# Patient Record
Sex: Female | Born: 1980 | Race: Black or African American | Hispanic: No | Marital: Single | Smoking: Never smoker
Health system: Southern US, Community
[De-identification: ages and names within clinical notes are randomized; demographics above are authoritative.]

---

## 2016-09-02 ENCOUNTER — Emergency Department (HOSPITAL_COMMUNITY)
Admission: EM | Admit: 2016-09-02 | Discharge: 2016-09-02 | Disposition: A | Discharge status: Home / Self Care | Payer: Medicaid Other | Attending: Emergency Medicine | Admitting: Emergency Medicine

## 2016-09-02 ENCOUNTER — Encounter (HOSPITAL_COMMUNITY): Payer: Self-pay | Admitting: Nurse Practitioner

## 2016-09-02 ENCOUNTER — Emergency Department (HOSPITAL_COMMUNITY): Payer: Medicaid Other

## 2016-09-02 DIAGNOSIS — R079 Chest pain, unspecified: Secondary | ICD-10-CM | POA: Insufficient documentation

## 2016-09-02 DIAGNOSIS — Z5321 Procedure and treatment not carried out due to patient leaving prior to being seen by health care provider: Secondary | ICD-10-CM | POA: Insufficient documentation

## 2016-09-02 LAB — BASIC METABOLIC PANEL
ANION GAP: 8 (ref 5–15)
BUN: 10 mg/dL (ref 6–20)
CO2: 27 mmol/L (ref 22–32)
Calcium: 9.1 mg/dL (ref 8.9–10.3)
Chloride: 101 mmol/L (ref 101–111)
Creatinine, Ser: 0.82 mg/dL (ref 0.44–1.00)
GFR calc Af Amer: >60 mL/min (ref >60)
GFR calc non Af Amer: >60 mL/min (ref >60)
GLUCOSE: 104 mg/dL — AB (ref 65–99)
POTASSIUM: 3.6 mmol/L (ref 3.5–5.1)
Sodium: 136 mmol/L (ref 135–145)

## 2016-09-02 LAB — I-STAT TROPONIN, ED: Troponin i, poc: 0 ng/mL (ref 0.00–0.08)

## 2016-09-02 LAB — CBC
HEMATOCRIT: 32.8 % — AB (ref 36.0–46.0)
HEMOGLOBIN: 10.5 g/dL — AB (ref 12.0–15.0)
MCH: 22.8 pg — AB (ref 26.0–34.0)
MCHC: 32 g/dL (ref 30.0–36.0)
MCV: 71.1 fL — ABNORMAL LOW (ref 78.0–100.0)
Platelets: 274 10*3/uL (ref 150–400)
RBC: 4.61 MIL/uL (ref 3.87–5.11)
RDW: 15.5 % (ref 11.5–15.5)
WBC: 8.8 10*3/uL (ref 4.0–10.5)

## 2016-09-02 NOTE — ED Notes (Signed)
Pt to nurse first asking about wait time. Pt informed pt of wait time and pt states her wish to come back in the morning, pt encouraged to stay and is sitting back with family ion the waiting room at this moment.

## 2016-09-02 NOTE — ED Notes (Signed)
Pt states she will go to her doc. In the morning.

## 2016-09-02 NOTE — ED Triage Notes (Signed)
Pt presents with c/o chest pain. The pain has been intermittent over the past year. She reports fatigue, dizziness, shortness of breath, lower extremity edema,  Nausea. She denies weakness, fevers, cough. The symptoms are improved with rest. The pain increased severely when she was outside working in the heat today so she decided to seek treatment, but has not been evaluated for these symptoms in the past

## 2016-09-03 ENCOUNTER — Emergency Department (HOSPITAL_COMMUNITY)
Admission: EM | Admit: 2016-09-03 | Discharge: 2016-09-03 | Disposition: A | Discharge status: Home / Self Care | Payer: Medicaid Other | Attending: Emergency Medicine | Admitting: Emergency Medicine

## 2016-09-03 ENCOUNTER — Encounter (HOSPITAL_COMMUNITY): Payer: Self-pay | Admitting: Emergency Medicine

## 2016-09-03 DIAGNOSIS — R609 Edema, unspecified: Secondary | ICD-10-CM

## 2016-09-03 DIAGNOSIS — R600 Localized edema: Secondary | ICD-10-CM | POA: Insufficient documentation

## 2016-09-03 DIAGNOSIS — D649 Anemia, unspecified: Secondary | ICD-10-CM | POA: Insufficient documentation

## 2016-09-03 DIAGNOSIS — Z79899 Other long term (current) drug therapy: Secondary | ICD-10-CM | POA: Diagnosis not present

## 2016-09-03 DIAGNOSIS — R2243 Localized swelling, mass and lump, lower limb, bilateral: Secondary | ICD-10-CM | POA: Diagnosis present

## 2016-09-03 LAB — BASIC METABOLIC PANEL
ANION GAP: 6 (ref 5–15)
BUN: 10 mg/dL (ref 6–20)
CHLORIDE: 102 mmol/L (ref 101–111)
CO2: 28 mmol/L (ref 22–32)
Calcium: 8.5 mg/dL — ABNORMAL LOW (ref 8.9–10.3)
Creatinine, Ser: 0.72 mg/dL (ref 0.44–1.00)
GFR calc Af Amer: >60 mL/min (ref >60)
GFR calc non Af Amer: >60 mL/min (ref >60)
Glucose, Bld: 75 mg/dL (ref 65–99)
POTASSIUM: 4.2 mmol/L (ref 3.5–5.1)
SODIUM: 136 mmol/L (ref 135–145)

## 2016-09-03 LAB — CBC WITH DIFFERENTIAL/PLATELET
Basophils Absolute: 0 10*3/uL (ref 0.0–0.1)
Basophils Relative: 1 %
EOS ABS: 0.1 10*3/uL (ref 0.0–0.7)
Eosinophils Relative: 2 %
HCT: 31.7 % — ABNORMAL LOW (ref 36.0–46.0)
HEMOGLOBIN: 10.2 g/dL — AB (ref 12.0–15.0)
LYMPHS ABS: 2.6 10*3/uL (ref 0.7–4.0)
LYMPHS PCT: 39 %
MCH: 23.1 pg — AB (ref 26.0–34.0)
MCHC: 32.2 g/dL (ref 30.0–36.0)
MCV: 71.9 fL — ABNORMAL LOW (ref 78.0–100.0)
Monocytes Absolute: 0.5 10*3/uL (ref 0.1–1.0)
Monocytes Relative: 7 %
NEUTROS PCT: 51 %
Neutro Abs: 3.5 10*3/uL (ref 1.7–7.7)
Platelets: 268 10*3/uL (ref 150–400)
RBC: 4.41 MIL/uL (ref 3.87–5.11)
RDW: 15.5 % (ref 11.5–15.5)
WBC: 6.7 10*3/uL (ref 4.0–10.5)

## 2016-09-03 LAB — BRAIN NATRIURETIC PEPTIDE: B NATRIURETIC PEPTIDE 5: 43 pg/mL (ref 0.0–100.0)

## 2016-09-03 LAB — TROPONIN I: Troponin I: <0.03 ng/mL (ref <0.03)

## 2016-09-03 MED ORDER — FUROSEMIDE 20 MG PO TABS: 20.0000 mg | ORAL_TABLET | Freq: Every day | ORAL | 0 refills | Status: AC

## 2016-09-03 NOTE — ED Provider Notes (Signed)
[facility]-EMER[facility]ENCY DEPT Provider Note   CSN: [id number] Arrival date & time: 09/03/16  1005     History   Chief Complaint Chief Complaint  Patient presents with  . Leg Swelling    HPI Diana Wong Foot is a 36 y.o. female.  HPI Pt states she was has been having trouble with fatigue, chest discomfort and leg swelling for the last two weeks.  It seems to be getting worse.  She has been drinking a lot of water but it has not helped.  It is better in the morning and worse as the day progresses.   She went to see her doctor today who sent her to the ED. History reviewed. No pertinent past medical history.  There are no active problems to display for this patient.   History reviewed. No pertinent surgical history.  OB History    No data available       Home Medications    Prior to Admission medications   Medication Sig Start Date End Date Taking? Authorizing Provider  furosemide (LASIX) 20 MG tablet Take 1 tablet (20 mg total) by mouth daily. 09/03/16   Linwood DibblesKnapp, Sani Loiseau, MD    Family History History reviewed. No pertinent family history.  Social History Social History  Substance Use Topics  . Smoking status: Never Smoker  . Smokeless tobacco: Never Used  . Alcohol use No     Allergies   Patient has no known allergies.   Review of Systems Review of Systems  Constitutional: Negative for fever.  Gastrointestinal: Positive for nausea.       Acid reflux sx  Neurological: Positive for headaches.  All other systems reviewed and are negative.    Physical Exam Updated Vital Signs BP 113/76 (BP Location: Left Arm)   Pulse (!) 59   Temp 98.1 F (36.7 C) (Oral)   Resp 18   Ht 1.753 m (5\' 9" )   Wt (!) 148.8 kg (328 lb)   LMP 08/15/2016 (Approximate)   SpO2 98%   BMI 48.44 kg/m   Physical Exam  Constitutional: She appears well-developed and well-nourished. No distress.  HENT:  Head: Normocephalic and atraumatic.  Right Ear: External ear normal.  Left Ear: External  ear normal.  Eyes: Conjunctivae are normal. Right eye exhibits no discharge. Left eye exhibits no discharge. No scleral icterus.  Neck: Neck supple. No tracheal deviation present.  Cardiovascular: Normal rate, regular rhythm and intact distal pulses.   Pulmonary/Chest: Effort normal and breath sounds normal. No stridor. No respiratory distress. She has no wheezes. She has no rales.  Abdominal: Soft. Bowel sounds are normal. She exhibits no distension. There is no tenderness. There is no rebound and no guarding.  Musculoskeletal: She exhibits no edema or tenderness.  Neurological: She is alert. She has normal strength. No cranial nerve deficit (no facial droop, extraocular movements intact, no slurred speech) or sensory deficit. She exhibits normal muscle tone. She displays no seizure activity. Coordination normal.  Skin: Skin is warm and dry. No rash noted.  Psychiatric: She has a normal mood and affect.  Nursing note and vitals reviewed.    ED Treatments / Results  Labs (all labs ordered are listed, but only abnormal results are displayed) Labs Reviewed  CBC WITH DIFFERENTIAL/PLATELET - Abnormal; Notable for the following:       Result Value   Hemoglobin 10.2 (*)    HCT 31.7 (*)    MCV 71.9 (*)    MCH 23.1 (*)    All other components  within normal limits  BASIC METABOLIC PANEL - Abnormal; Notable for the following:    Calcium 8.5 (*)    All other components within normal limits  BRAIN NATRIURETIC PEPTIDE  TROPONIN I     Radiology Dg Chest 2 View  Result Date: 09/02/2016 CLINICAL DATA:  36 year old female with chest pain. EXAM: CHEST  2 VIEW COMPARISON:  None. FINDINGS: The heart size and mediastinal contours are within normal limits. Both lungs are clear. The visualized skeletal structures are unremarkable. IMPRESSION: No active cardiopulmonary disease. Electronically Signed   By: Elgie Collard M.D.   On: 09/02/2016 19:50    Procedures Procedures (including critical care  time)  Medications Ordered in ED Medications - No data to display   Initial Impression / Assessment and Plan / ED Course  I have reviewed the triage vital signs and the nursing notes.  Pertinent labs & imaging results that were available during my care of the patient were reviewed by me and considered in my medical decision making (see chart for details).   Pt presented to the ED for peripheral edema and fatigue.  Pt also complained of chest pain.  Pt is low risk heart score for MACE.   Doubt ACS as the cause of her sx.     No signs of CHF.  No PE risk factors.  Perc negative.  Doubt PE.  I suspect her symptoms are related to peripheral edema.  Discussed diuretic use, diet, exercise, weight loss.  Follow up with PCP to check her anemia, most likely iron deficiency.  Consider thyroid testing, anemia testing.  At this time there does not appear to be any evidence of an acute emergency medical condition and the patient appears stable for discharge with appropriate outpatient follow up.   Final Clinical Impressions([facility]) / ED Diagnoses   Final diagnoses:  Peripheral edema  Anemia, unspecified type    New Prescriptions New Prescriptions   FUROSEMIDE (LASIX) 20 MG TABLET    Take 1 tablet (20 mg total) by mouth daily.     Linwood Dibbles, MD 09/03/16 [phone]

## 2016-09-03 NOTE — ED Triage Notes (Signed)
Patient complaining of bilateral leg swelling x 2 weeks. Also states she has had chest pain and shortness of breath with exertion x 1 year. States she was seen at [facility] last night and PCP this morning. Denies chest pain at triage.

## 2016-09-03 NOTE — Discharge Instructions (Signed)
Follow up with your primary care doctor as we discussed for further evaluation, take the diuretics to see if it helps with your swelling

## 2018-04-23 IMAGING — DX DG CHEST 2V
2 series · 2 of 2 positions shown · non-contrast
Comparison: None.

CLINICAL DATA: 35-year-old female with chest pain.

EXAM:
CHEST  2 VIEW

[w chest pa]
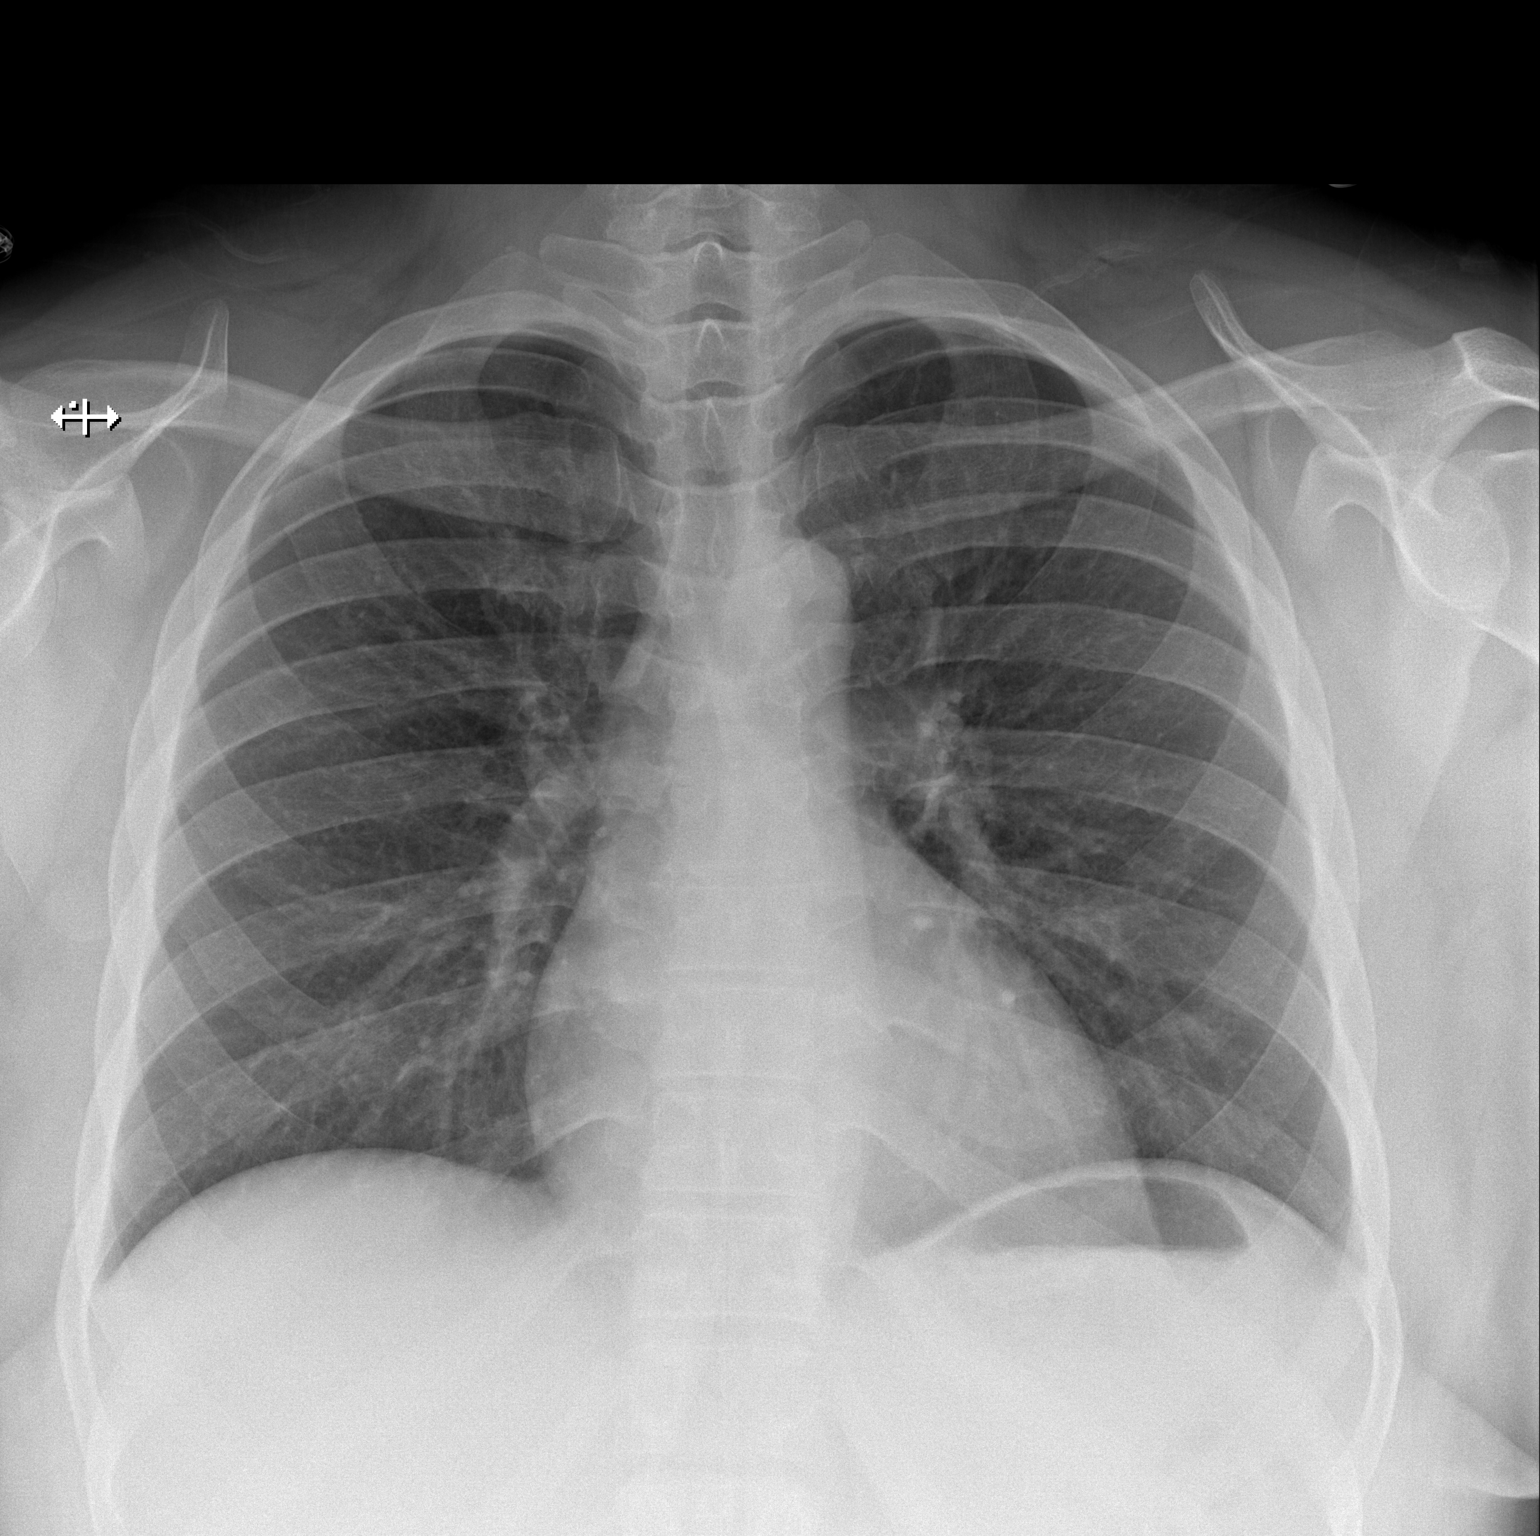

[w chest lat]
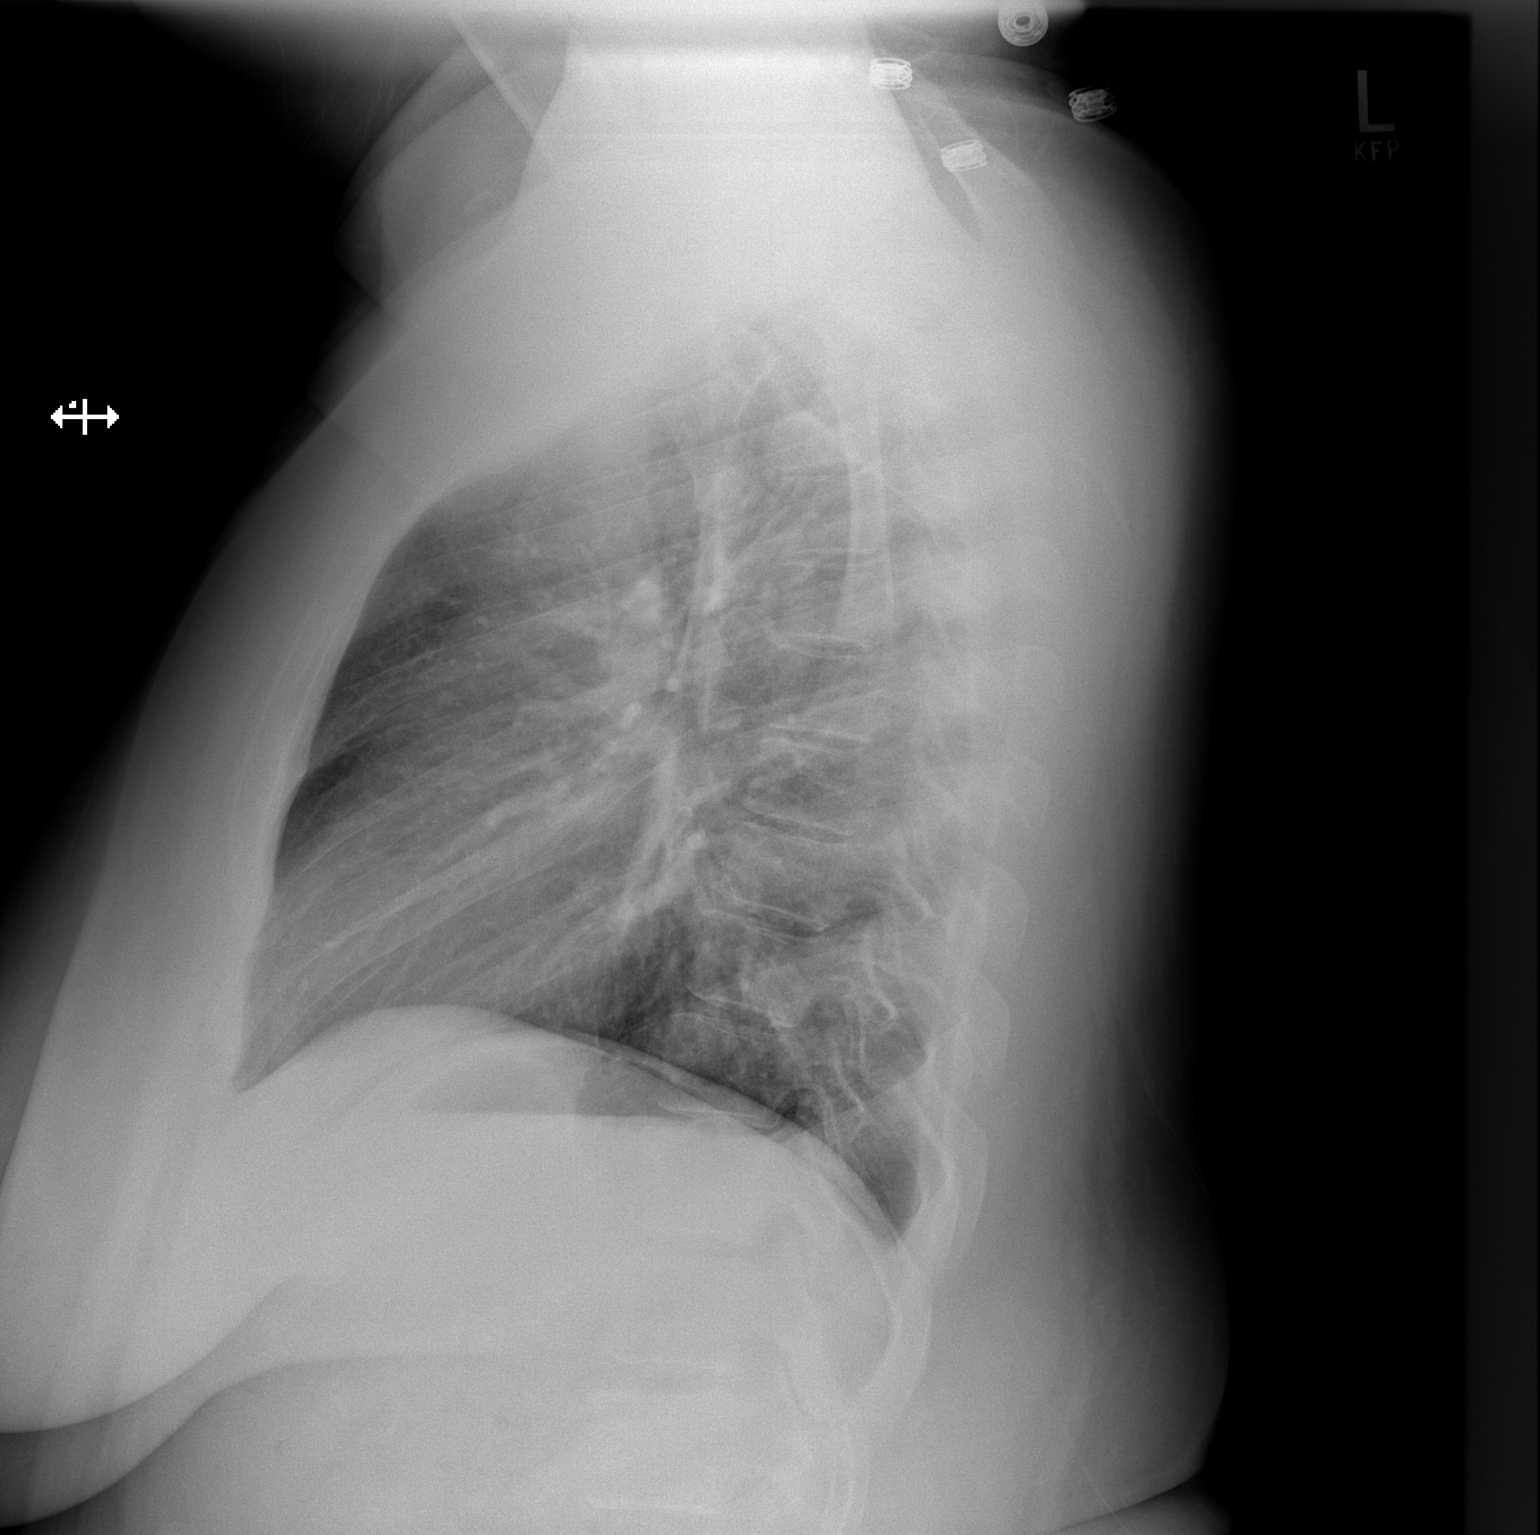

[2 of 2 positions shown; findings below may reference images not displayed]

FINDINGS: The heart size and mediastinal contours are within normal limits.
Both lungs are clear. The visualized skeletal structures are
unremarkable.
IMPRESSION: No active cardiopulmonary disease.
